# Patient Record
Sex: Male | Born: 1955 | Race: White | Hispanic: No | Marital: Married | Smoking: Never smoker
Health system: Southern US, Community
[De-identification: ages and names within clinical notes are randomized; demographics above are authoritative.]

## PROBLEM LIST (undated history)

## (undated) DIAGNOSIS — M1990 Unspecified osteoarthritis, unspecified site: Secondary | ICD-10-CM

## (undated) DIAGNOSIS — R51 Headache: Secondary | ICD-10-CM

## (undated) DIAGNOSIS — F419 Anxiety disorder, unspecified: Secondary | ICD-10-CM

## (undated) DIAGNOSIS — G4730 Sleep apnea, unspecified: Secondary | ICD-10-CM

## (undated) DIAGNOSIS — R519 Headache, unspecified: Secondary | ICD-10-CM

## (undated) HISTORY — PX: DG GREAT TOE LEFT FOOT: HXRAD1656

---

## 2002-05-09 ENCOUNTER — Ambulatory Visit (HOSPITAL_BASED_OUTPATIENT_CLINIC_OR_DEPARTMENT_OTHER): Admission: RE | Admit: 2002-05-09 | Discharge: 2002-05-09 | Payer: Self-pay | Admitting: Otolaryngology

## 2011-11-14 HISTORY — PX: COLONOSCOPY: SHX174

## 2017-06-26 NOTE — H&P (Signed)
TOTAL KNEE ADMISSION H&P  Patient is being admitted for right total knee arthroplasty.  Subjective:  Chief Complaint:right knee pain.  HPI: Justin RoyalsGreg R Charles, 61 y.o. male, has a history of pain and functional disability in the right knee due to arthritis and has failed non-surgical conservative treatments for greater than 12 weeks to includeNSAID's and/or analgesics and corticosteriod injections.  Onset of symptoms was gradual, starting 5 years ago with gradually worsening course since that time. The patient noted no past surgery on the right knee(s).  Patient currently rates pain in the right knee(s) at 4 out of 10 with activity. Patient has pain that interferes with activities of daily living.  Patient has evidence of subchondral sclerosis and joint space narrowing by imaging studies. There is no active infection.  There are no active problems to display for this patient.  No past medical history on file.  No past surgical history on file.  No prescriptions prior to admission.   Allergies not on file  Social History  Substance Use Topics  . Smoking status: Not on file  . Smokeless tobacco: Not on file  . Alcohol use Not on file    No family history on file.   Review of Systems  Constitutional: Negative.   HENT: Negative.   Eyes: Negative.   Respiratory: Negative.   Cardiovascular: Negative.   Gastrointestinal: Negative.   Genitourinary: Negative.   Musculoskeletal: Positive for joint pain.  Skin: Negative.   Neurological: Negative.   Endo/Heme/Allergies: Negative.   Psychiatric/Behavioral: Negative.     Objective:  Physical Exam  Constitutional: He is oriented to person, place, and time. He appears well-developed and well-nourished.  HENT:  Head: Normocephalic and atraumatic.  Eyes: Pupils are equal, round, and reactive to light. EOM are normal.  Neck: Normal range of motion. Neck supple.  Cardiovascular: Normal rate and regular rhythm.   Respiratory: Effort normal and  breath sounds normal.  GI: Soft. Bowel sounds are normal.  Musculoskeletal:  Examination of his right knee reveals range of motion 0-120 degrees; medial joint line tenderness; moderate patellofemoral crepitus.  Ligaments are stable.  He is neurovascularly intact distally.   Neurological: He is alert and oriented to person, place, and time.  Skin: Skin is warm and dry.  Psychiatric: He has a normal mood and affect. His behavior is normal. Judgment and thought content normal.    Vital signs in last 24 hours:    Labs:   There is no height or weight on file to calculate BMI.   Imaging Review Plain radiographs demonstrate severe degenerative joint disease of the right knee(s). The overall alignment ismild varus. The bone quality appears to be fair for age and reported activity level.  Assessment/Plan:  End stage arthritis, right knee   The patient history, physical examination, clinical judgment of the provider and imaging studies are consistent with end stage degenerative joint disease of the right knee(s) and total knee arthroplasty is deemed medically necessary. The treatment options including medical management, injection therapy arthroscopy and arthroplasty were discussed at length. The risks and benefits of total knee arthroplasty were presented and reviewed. The risks due to aseptic loosening, infection, stiffness, patella tracking problems, thromboembolic complications and other imponderables were discussed. The patient acknowledged the explanation, agreed to proceed with the plan and consent was signed. Patient is being admitted for inpatient treatment for surgery, pain control, PT, OT, prophylactic antibiotics, VTE prophylaxis, progressive ambulation and ADL's and discharge planning. The patient is planning to be discharged home with  home health services

## 2017-06-29 ENCOUNTER — Encounter (HOSPITAL_COMMUNITY): Payer: Self-pay

## 2017-06-29 ENCOUNTER — Encounter (HOSPITAL_COMMUNITY)
Admission: RE | Admit: 2017-06-29 | Discharge: 2017-06-29 | Disposition: A | Discharge status: Home / Self Care | Payer: Managed Care, Other (non HMO) | Source: Ambulatory Visit | Attending: Orthopedic Surgery | Admitting: Orthopedic Surgery

## 2017-06-29 DIAGNOSIS — Z01812 Encounter for preprocedural laboratory examination: Secondary | ICD-10-CM | POA: Insufficient documentation

## 2017-06-29 DIAGNOSIS — M1711 Unilateral primary osteoarthritis, right knee: Secondary | ICD-10-CM | POA: Diagnosis not present

## 2017-06-29 DIAGNOSIS — Z01810 Encounter for preprocedural cardiovascular examination: Secondary | ICD-10-CM | POA: Insufficient documentation

## 2017-06-29 DIAGNOSIS — I4510 Unspecified right bundle-branch block: Secondary | ICD-10-CM | POA: Insufficient documentation

## 2017-06-29 HISTORY — DX: Headache, unspecified: R51.9

## 2017-06-29 HISTORY — DX: Headache: R51

## 2017-06-29 HISTORY — DX: Anxiety disorder, unspecified: F41.9

## 2017-06-29 HISTORY — DX: Unspecified osteoarthritis, unspecified site: M19.90

## 2017-06-29 LAB — URINALYSIS, COMPLETE (UACMP) WITH MICROSCOPIC
Bacteria, UA: NONE SEEN
Bilirubin Urine: NEGATIVE
Glucose, UA: NEGATIVE mg/dL
HGB URINE DIPSTICK: NEGATIVE
Ketones, ur: NEGATIVE mg/dL
Leukocytes, UA: NEGATIVE
Nitrite: NEGATIVE
PH: 5 (ref 5.0–8.0)
Protein, ur: NEGATIVE mg/dL
SPECIFIC GRAVITY, URINE: 1.011 (ref 1.005–1.030)
Squamous Epithelial / LPF: NONE SEEN

## 2017-06-29 LAB — SURGICAL PCR SCREEN
MRSA, PCR: NEGATIVE
Staphylococcus aureus: POSITIVE — AB

## 2017-06-29 LAB — CBC
HCT: 44.1 % (ref 39.0–52.0)
HEMOGLOBIN: 15.1 g/dL (ref 13.0–17.0)
MCH: 33 pg (ref 26.0–34.0)
MCHC: 34.2 g/dL (ref 30.0–36.0)
MCV: 96.3 fL (ref 78.0–100.0)
Platelets: 158 10*3/uL (ref 150–400)
RBC: 4.58 MIL/uL (ref 4.22–5.81)
RDW: 12.8 % (ref 11.5–15.5)
WBC: 6.3 10*3/uL (ref 4.0–10.5)

## 2017-06-29 LAB — BASIC METABOLIC PANEL
ANION GAP: 9 (ref 5–15)
BUN: 14 mg/dL (ref 6–20)
CALCIUM: 9.8 mg/dL (ref 8.9–10.3)
CO2: 27 mmol/L (ref 22–32)
Chloride: 105 mmol/L (ref 101–111)
Creatinine, Ser: 1.01 mg/dL (ref 0.61–1.24)
GFR calc Af Amer: >60 mL/min (ref >60)
GFR calc non Af Amer: >60 mL/min (ref >60)
Glucose, Bld: 100 mg/dL — ABNORMAL HIGH (ref 65–99)
Potassium: 4.3 mmol/L (ref 3.5–5.1)
Sodium: 141 mmol/L (ref 135–145)

## 2017-06-29 LAB — TYPE AND SCREEN
ABO/RH(D): A POS
ANTIBODY SCREEN: NEGATIVE

## 2017-06-29 LAB — ABO/RH: ABO/RH(D): A POS

## 2017-06-29 NOTE — Progress Notes (Addendum)
WJX:BJYNW, Denny Peon, MD  Cardiologist: pt denies  EKG: pt denies past year  Stress test: pt denies ever  ECHO: pt denies ever  Cardiac Cath: pt denies ever  Chest x-ray: pt denies past year, no recent respiratory complications

## 2017-06-29 NOTE — Pre-Procedure Instructions (Signed)
    Justin Charles  06/29/2017     No Pharmacies Listed   Your procedure is scheduled on July 11, 2017.  Report to [facility] Admitting at 530 AM.  Call this number if you have problems the morning of surgery:  [phone]   Remember:  Do not eat food or drink liquids after midnight.  Take these medicines the morning of surgery with A SIP OF WATER: (none)  7 days prior to surgery STOP taking any Aspirin, Aleve, Naproxen, Ibuprofen, Motrin, Advil, Goody's, BC's, all herbal medications, fish oil, and all vitamins   Do not wear jewelry, make-up or nail polish.  Do not wear lotions, powders, or perfumes, or deoderant.   Men may shave face and neck.  Do not bring valuables to the hospital.  [facility] is not responsible for any belongings or valuables.  Contacts, dentures or bridgework may not be worn into surgery.  Leave your suitcase in the car.  After surgery it may be brought to your room.  For patients admitted to the hospital, discharge time will be determined by your treatment team.  Patients discharged the day of surgery will not be allowed to drive home.    Special instructions:   [place]- Preparing For Surgery  Before surgery, you can play an important role. Because skin is not sterile, your skin needs to be as free of germs as possible. You can reduce the number of germs on your skin by washing with CHG (chlorahexidine gluconate) Soap before surgery.  CHG is an antiseptic cleaner which kills germs and bonds with the skin to continue killing germs even after washing.  Please do not use if you have an allergy to CHG or antibacterial soaps. If your skin becomes reddened/irritated stop using the CHG.  Do not shave (including legs and underarms) for at least 48 hours prior to first CHG shower. It is OK to shave your face.  Please follow these instructions carefully.   1. Shower the NIGHT BEFORE SURGERY and the MORNING OF SURGERY with CHG.   2. If you chose  to wash your hair, wash your hair first as usual with your normal shampoo.  3. After you shampoo, rinse your hair and body thoroughly to remove the shampoo.  4. Use CHG as you would any other liquid soap. You can apply CHG directly to the skin and wash gently with a scrungie or a clean washcloth.   5. Apply the CHG Soap to your body ONLY FROM THE NECK DOWN.  Do not use on open wounds or open sores. Avoid contact with your eyes, ears, mouth and genitals (private parts). Wash genitals (private parts) with your normal soap.  6. Wash thoroughly, paying special attention to the area where your surgery will be performed.  7. Thoroughly rinse your body with warm water from the neck down.  8. DO NOT shower/wash with your normal soap after using and rinsing off the CHG Soap.  9. Pat yourself dry with a CLEAN TOWEL.   10. Wear CLEAN PAJAMAS   11. Place CLEAN SHEETS on your bed the night of your first shower and DO NOT SLEEP WITH PETS.    Day of Surgery: Do not apply any deodorants/lotions. Please wear clean clothes to the hospital/surgery center.     Please read over the fact sheets that you were given.

## 2017-06-30 LAB — URINE CULTURE: Culture: NO GROWTH

## 2017-07-02 NOTE — Progress Notes (Addendum)
Anesthesia Chart Review:  Pt is a 61 year old male scheduled for R total knee arthroplasty on 07/11/2017 with Mckinley Jewel, MD  - PCP is Derek Jack, MD (notes in care everywhere)   PMH includes:  Arthritis. Never smoker. BMI 31  Medications reviewed  Preoperative labs reviewed.    EKG 06/29/17: NSR. RBBB. LAFB. Bifascicular block. Appears stable when compared to EKG 02/16/16  If no changes, I anticipate pt can proceed with surgery as scheduled.   Rica Mast, FNP-BC [facility] Short Stay Surgical Center/Anesthesiology Phone: [phone] 07/04/2017 11:55 AM

## 2017-07-10 MED ORDER — TRANEXAMIC ACID 1000 MG/10ML IV SOLN
1000.0000 mg | INTRAVENOUS | Status: AC
  Administered 2017-07-11: 1000 mg via INTRAVENOUS
  Filled 2017-07-10: qty 10

## 2017-07-10 MED ORDER — CEFAZOLIN SODIUM-DEXTROSE 2-4 GM/100ML-% IV SOLN
2.0000 g | INTRAVENOUS | Status: AC
  Administered 2017-07-11: 2 g via INTRAVENOUS
  Filled 2017-07-10: qty 100

## 2017-07-11 ENCOUNTER — Inpatient Hospital Stay (HOSPITAL_COMMUNITY): Payer: Managed Care, Other (non HMO) | Admitting: Anesthesiology

## 2017-07-11 ENCOUNTER — Inpatient Hospital Stay (HOSPITAL_COMMUNITY): Payer: Managed Care, Other (non HMO) | Admitting: Emergency Medicine

## 2017-07-11 ENCOUNTER — Encounter (HOSPITAL_COMMUNITY)
Admission: RE | Disposition: A | Discharge status: Home / Self Care | Payer: Self-pay | Source: Home / Self Care | Attending: Orthopedic Surgery

## 2017-07-11 ENCOUNTER — Inpatient Hospital Stay (HOSPITAL_COMMUNITY)
Admission: RE | Admit: 2017-07-11 | Discharge: 2017-07-12 | DRG: 470 | Disposition: A | Discharge status: Home / Self Care | Payer: Managed Care, Other (non HMO) | Attending: Orthopedic Surgery | Admitting: Orthopedic Surgery

## 2017-07-11 ENCOUNTER — Inpatient Hospital Stay (HOSPITAL_COMMUNITY): Payer: Managed Care, Other (non HMO)

## 2017-07-11 ENCOUNTER — Encounter (HOSPITAL_COMMUNITY): Payer: Self-pay | Admitting: Urology

## 2017-07-11 DIAGNOSIS — I959 Hypotension, unspecified: Secondary | ICD-10-CM | POA: Diagnosis not present

## 2017-07-11 DIAGNOSIS — R55 Syncope and collapse: Secondary | ICD-10-CM | POA: Diagnosis not present

## 2017-07-11 DIAGNOSIS — M1711 Unilateral primary osteoarthritis, right knee: Secondary | ICD-10-CM | POA: Diagnosis present

## 2017-07-11 DIAGNOSIS — Z96659 Presence of unspecified artificial knee joint: Secondary | ICD-10-CM

## 2017-07-11 HISTORY — DX: Sleep apnea, unspecified: G47.30

## 2017-07-11 HISTORY — PX: TOTAL KNEE ARTHROPLASTY: SHX125

## 2017-07-11 SURGERY — ARTHROPLASTY, KNEE, TOTAL
Anesthesia: Spinal | Site: Knee | Laterality: Right

## 2017-07-11 MED ORDER — OXYCODONE HCL 5 MG PO TABS
5.0000 mg | ORAL_TABLET | ORAL | Status: DC | PRN
  Administered 2017-07-11: 10 mg via ORAL
  Administered 2017-07-11: 5 mg via ORAL
  Administered 2017-07-12 (×4): 10 mg via ORAL
  Filled 2017-07-11 (×5): qty 2
  Filled 2017-07-11: qty 1

## 2017-07-11 MED ORDER — ASPIRIN EC 325 MG PO TBEC
325.0000 mg | DELAYED_RELEASE_TABLET | Freq: Every day | ORAL | Status: DC
  Administered 2017-07-12: 325 mg via ORAL
  Filled 2017-07-11: qty 1

## 2017-07-11 MED ORDER — BUPIVACAINE IN DEXTROSE 0.75-8.25 % IT SOLN
INTRATHECAL | Status: DC | PRN
  Administered 2017-07-11: 2 mL via INTRATHECAL

## 2017-07-11 MED ORDER — ACETAMINOPHEN 650 MG RE SUPP: 650.0000 mg | Freq: Four times a day (QID) | RECTAL | Status: DC | PRN

## 2017-07-11 MED ORDER — CHLORHEXIDINE GLUCONATE 4 % EX LIQD: 60.0000 mL | Freq: Once | CUTANEOUS | Status: DC

## 2017-07-11 MED ORDER — MENTHOL 3 MG MT LOZG: 1.0000 | LOZENGE | OROMUCOSAL | Status: DC | PRN

## 2017-07-11 MED ORDER — SODIUM CHLORIDE 0.9 % IJ SOLN
INTRAMUSCULAR | Status: DC | PRN
  Administered 2017-07-11: 40 mL

## 2017-07-11 MED ORDER — MIDAZOLAM HCL 5 MG/5ML IJ SOLN
INTRAMUSCULAR | Status: DC | PRN
  Administered 2017-07-11: 2 mg via INTRAVENOUS

## 2017-07-11 MED ORDER — ASPIRIN EC 325 MG PO TBEC: 325.0000 mg | DELAYED_RELEASE_TABLET | Freq: Every day | ORAL | 0 refills | Status: AC

## 2017-07-11 MED ORDER — LIDOCAINE 2% (20 MG/ML) 5 ML SYRINGE
INTRAMUSCULAR | Status: AC
  Filled 2017-07-11: qty 5

## 2017-07-11 MED ORDER — ACETAMINOPHEN 325 MG PO TABS
650.0000 mg | ORAL_TABLET | Freq: Four times a day (QID) | ORAL | Status: DC | PRN
  Administered 2017-07-12: 650 mg via ORAL
  Filled 2017-07-11: qty 2

## 2017-07-11 MED ORDER — BISACODYL 5 MG PO TBEC
5.0000 mg | DELAYED_RELEASE_TABLET | Freq: Every day | ORAL | Status: DC | PRN
Start: 2017-07-11 — End: 2017-07-12

## 2017-07-11 MED ORDER — ONDANSETRON HCL 4 MG PO TABS: 4.0000 mg | ORAL_TABLET | Freq: Three times a day (TID) | ORAL | 0 refills | Status: AC | PRN

## 2017-07-11 MED ORDER — LIDOCAINE 2% (20 MG/ML) 5 ML SYRINGE
INTRAMUSCULAR | Status: DC | PRN
  Administered 2017-07-11: 25 mg via INTRAVENOUS

## 2017-07-11 MED ORDER — CEFAZOLIN SODIUM-DEXTROSE 2-4 GM/100ML-% IV SOLN
2.0000 g | Freq: Four times a day (QID) | INTRAVENOUS | Status: AC
  Administered 2017-07-11 (×2): 2 g via INTRAVENOUS
  Filled 2017-07-11 (×2): qty 100

## 2017-07-11 MED ORDER — BUPIVACAINE LIPOSOME 1.3 % IJ SUSP
INTRAMUSCULAR | Status: DC | PRN
  Administered 2017-07-11: 20 mL

## 2017-07-11 MED ORDER — DEXAMETHASONE SODIUM PHOSPHATE 10 MG/ML IJ SOLN
INTRAMUSCULAR | Status: AC
  Filled 2017-07-11: qty 1

## 2017-07-11 MED ORDER — PROPOFOL 1000 MG/100ML IV EMUL
INTRAVENOUS | Status: AC
  Filled 2017-07-11: qty 100

## 2017-07-11 MED ORDER — CELECOXIB 200 MG PO CAPS
200.0000 mg | ORAL_CAPSULE | Freq: Two times a day (BID) | ORAL | Status: DC
  Administered 2017-07-11 – 2017-07-12 (×3): 200 mg via ORAL
  Filled 2017-07-11 (×3): qty 1

## 2017-07-11 MED ORDER — PHENOL 1.4 % MT LIQD: 1.0000 | OROMUCOSAL | Status: DC | PRN

## 2017-07-11 MED ORDER — CALCIUM CARBONATE ANTACID 750 MG PO CHEW: 2.0000 | CHEWABLE_TABLET | ORAL | Status: DC | PRN

## 2017-07-11 MED ORDER — LACTATED RINGERS IV SOLN
INTRAVENOUS | Status: DC | PRN
  Administered 2017-07-11 (×2): via INTRAVENOUS

## 2017-07-11 MED ORDER — POLYETHYLENE GLYCOL 3350 17 G PO PACK: 17.0000 g | PACK | Freq: Every day | ORAL | Status: DC | PRN

## 2017-07-11 MED ORDER — SODIUM CHLORIDE 0.9 % IV BOLUS (SEPSIS)
1000.0000 mL | Freq: Once | INTRAVENOUS | Status: AC
  Administered 2017-07-11: 1000 mL via INTRAVENOUS

## 2017-07-11 MED ORDER — OXYCODONE-ACETAMINOPHEN 5-325 MG PO TABS: 1.0000 | ORAL_TABLET | ORAL | 0 refills | Status: AC | PRN

## 2017-07-11 MED ORDER — OXYCODONE HCL 5 MG PO TABS: 5.0000 mg | ORAL_TABLET | Freq: Once | ORAL | Status: DC | PRN

## 2017-07-11 MED ORDER — CALCIUM CARBONATE ANTACID 500 MG PO CHEW: 2.0000 | CHEWABLE_TABLET | ORAL | Status: DC | PRN

## 2017-07-11 MED ORDER — ZOLPIDEM TARTRATE 5 MG PO TABS: 5.0000 mg | ORAL_TABLET | Freq: Every evening | ORAL | Status: DC | PRN

## 2017-07-11 MED ORDER — ONDANSETRON HCL 4 MG/2ML IJ SOLN
INTRAMUSCULAR | Status: AC
  Filled 2017-07-11: qty 2

## 2017-07-11 MED ORDER — MIDAZOLAM HCL 2 MG/2ML IJ SOLN
INTRAMUSCULAR | Status: AC
  Filled 2017-07-11: qty 2

## 2017-07-11 MED ORDER — POTASSIUM CHLORIDE IN NACL 20-0.9 MEQ/L-% IV SOLN
INTRAVENOUS | Status: DC
  Administered 2017-07-11: 16:00:00 via INTRAVENOUS
  Filled 2017-07-11: qty 1000

## 2017-07-11 MED ORDER — DOCUSATE SODIUM 100 MG PO CAPS
100.0000 mg | ORAL_CAPSULE | Freq: Two times a day (BID) | ORAL | Status: DC
  Administered 2017-07-12: 100 mg via ORAL
  Filled 2017-07-11 (×2): qty 1

## 2017-07-11 MED ORDER — METOCLOPRAMIDE HCL 5 MG/ML IJ SOLN
5.0000 mg | Freq: Three times a day (TID) | INTRAMUSCULAR | Status: DC | PRN
Start: 2017-07-11 — End: 2017-07-12

## 2017-07-11 MED ORDER — BUPIVACAINE LIPOSOME 1.3 % IJ SUSP
20.0000 mL | INTRAMUSCULAR | Status: DC
  Filled 2017-07-11: qty 20

## 2017-07-11 MED ORDER — DEXAMETHASONE SODIUM PHOSPHATE 10 MG/ML IJ SOLN
INTRAMUSCULAR | Status: DC | PRN
  Administered 2017-07-11: 10 mg via INTRAVENOUS

## 2017-07-11 MED ORDER — PROPOFOL 500 MG/50ML IV EMUL
INTRAVENOUS | Status: DC | PRN
  Administered 2017-07-11: 100 ug/kg/min via INTRAVENOUS

## 2017-07-11 MED ORDER — HYDROMORPHONE HCL 1 MG/ML IJ SOLN: 0.5000 mg | INTRAMUSCULAR | Status: DC | PRN

## 2017-07-11 MED ORDER — MAGNESIUM CITRATE PO SOLN: 1.0000 | Freq: Once | ORAL | Status: DC | PRN

## 2017-07-11 MED ORDER — ONDANSETRON HCL 4 MG/2ML IJ SOLN
INTRAMUSCULAR | Status: DC | PRN
  Administered 2017-07-11: 4 mg via INTRAVENOUS

## 2017-07-11 MED ORDER — PROPOFOL 10 MG/ML IV BOLUS
INTRAVENOUS | Status: AC
  Filled 2017-07-11: qty 40

## 2017-07-11 MED ORDER — OXYCODONE HCL 5 MG/5ML PO SOLN: 5.0000 mg | Freq: Once | ORAL | Status: DC | PRN

## 2017-07-11 MED ORDER — ONDANSETRON HCL 4 MG PO TABS: 4.0000 mg | ORAL_TABLET | Freq: Four times a day (QID) | ORAL | Status: DC | PRN

## 2017-07-11 MED ORDER — METHOCARBAMOL 500 MG PO TABS
500.0000 mg | ORAL_TABLET | Freq: Four times a day (QID) | ORAL | Status: DC | PRN
  Administered 2017-07-12 (×2): 500 mg via ORAL
  Filled 2017-07-11 (×2): qty 1

## 2017-07-11 MED ORDER — METHOCARBAMOL 1000 MG/10ML IJ SOLN: 500.0000 mg | Freq: Four times a day (QID) | INTRAMUSCULAR | Status: DC | PRN

## 2017-07-11 MED ORDER — DIPHENHYDRAMINE HCL 12.5 MG/5ML PO ELIX
12.5000 mg | ORAL_SOLUTION | ORAL | Status: DC | PRN
  Administered 2017-07-11: 25 mg via ORAL
  Filled 2017-07-11: qty 10

## 2017-07-11 MED ORDER — ONDANSETRON HCL 4 MG/2ML IJ SOLN: 4.0000 mg | Freq: Four times a day (QID) | INTRAMUSCULAR | Status: DC | PRN

## 2017-07-11 MED ORDER — LACTATED RINGERS IV SOLN: INTRAVENOUS | Status: DC

## 2017-07-11 MED ORDER — SODIUM CHLORIDE 0.9 % IR SOLN
Status: DC | PRN
  Administered 2017-07-11: 1000 mL

## 2017-07-11 MED ORDER — FENTANYL CITRATE (PF) 250 MCG/5ML IJ SOLN
INTRAMUSCULAR | Status: AC
  Filled 2017-07-11: qty 5

## 2017-07-11 MED ORDER — METOCLOPRAMIDE HCL 5 MG PO TABS: 5.0000 mg | ORAL_TABLET | Freq: Three times a day (TID) | ORAL | Status: DC | PRN

## 2017-07-11 MED ORDER — HYDROMORPHONE HCL 1 MG/ML IJ SOLN: 0.2500 mg | INTRAMUSCULAR | Status: DC | PRN

## 2017-07-11 MED ORDER — DEXAMETHASONE SODIUM PHOSPHATE 10 MG/ML IJ SOLN
10.0000 mg | Freq: Once | INTRAMUSCULAR | Status: AC
  Administered 2017-07-12: 10 mg via INTRAVENOUS
  Filled 2017-07-11: qty 1

## 2017-07-11 SURGICAL SUPPLY — 54 items
BANDAGE ACE 4X5 VEL STRL LF (GAUZE/BANDAGES/DRESSINGS) ×3 IMPLANT
BANDAGE ACE 6X5 VEL STRL LF (GAUZE/BANDAGES/DRESSINGS) ×3 IMPLANT
BANDAGE ESMARK 6X9 LF (GAUZE/BANDAGES/DRESSINGS) ×1 IMPLANT
BLADE SAG 18X100X1.27 (BLADE) ×6 IMPLANT
BNDG ESMARK 6X9 LF (GAUZE/BANDAGES/DRESSINGS) ×3
BOWL SMART MIX CTS (DISPOSABLE) IMPLANT
CAPT KNEE TRIATH TK-4 ×3 IMPLANT
CLOSURE STERI-STRIP 1/2X4 (GAUZE/BANDAGES/DRESSINGS) ×1
CLOSURE WOUND 1/2 X4 (GAUZE/BANDAGES/DRESSINGS) ×2
CLSR STERI-STRIP ANTIMIC 1/2X4 (GAUZE/BANDAGES/DRESSINGS) ×2 IMPLANT
COVER SURGICAL LIGHT HANDLE (MISCELLANEOUS) ×3 IMPLANT
CUFF TOURNIQUET SINGLE 34IN LL (TOURNIQUET CUFF) ×3 IMPLANT
DRAPE EXTREMITY T 121X128X90 (DRAPE) ×3 IMPLANT
DRAPE U-SHAPE 47X51 STRL (DRAPES) ×3 IMPLANT
DRSG AQUACEL AG ADV 3.5X10 (GAUZE/BANDAGES/DRESSINGS) ×3 IMPLANT
DURAPREP 26ML APPLICATOR (WOUND CARE) ×6 IMPLANT
ELECT CAUTERY BLADE 6.4 (BLADE) ×3 IMPLANT
ELECT REM PT RETURN 9FT ADLT (ELECTROSURGICAL) ×3
ELECTRODE REM PT RTRN 9FT ADLT (ELECTROSURGICAL) ×1 IMPLANT
FACESHIELD WRAPAROUND (MASK) ×6 IMPLANT
GLOVE BIOGEL PI IND STRL 7.0 (GLOVE) ×1 IMPLANT
GLOVE BIOGEL PI INDICATOR 7.0 (GLOVE) ×2
GLOVE ECLIPSE 7.0 STRL STRAW (GLOVE) ×3 IMPLANT
GLOVE ORTHO TXT STRL SZ7.5 (GLOVE) ×3 IMPLANT
GOWN STRL REUS W/ TWL LRG LVL3 (GOWN DISPOSABLE) ×2 IMPLANT
GOWN STRL REUS W/ TWL XL LVL3 (GOWN DISPOSABLE) ×1 IMPLANT
GOWN STRL REUS W/TWL LRG LVL3 (GOWN DISPOSABLE) ×4
GOWN STRL REUS W/TWL XL LVL3 (GOWN DISPOSABLE) ×2
HANDPIECE INTERPULSE COAX TIP (DISPOSABLE) ×2
IMMOBILIZER KNEE 22 UNIV (SOFTGOODS) IMPLANT
KIT BASIN OR (CUSTOM PROCEDURE TRAY) ×3 IMPLANT
KIT ROOM TURNOVER OR (KITS) ×3 IMPLANT
MANIFOLD NEPTUNE II (INSTRUMENTS) ×3 IMPLANT
NEEDLE 18GX1X1/2 (RX/OR ONLY) (NEEDLE) ×3 IMPLANT
NEEDLE HYPO 25GX1X1/2 BEV (NEEDLE) IMPLANT
NS IRRIG 1000ML POUR BTL (IV SOLUTION) ×3 IMPLANT
PACK TOTAL JOINT (CUSTOM PROCEDURE TRAY) ×3 IMPLANT
PAD ARMBOARD 7.5X6 YLW CONV (MISCELLANEOUS) ×6 IMPLANT
SET HNDPC FAN SPRY TIP SCT (DISPOSABLE) ×1 IMPLANT
STRIP CLOSURE SKIN 1/2X4 (GAUZE/BANDAGES/DRESSINGS) ×4 IMPLANT
SUCTION FRAZIER HANDLE 10FR (MISCELLANEOUS)
SUCTION TUBE FRAZIER 10FR DISP (MISCELLANEOUS) IMPLANT
SUT MNCRL AB 4-0 PS2 18 (SUTURE) ×3 IMPLANT
SUT VIC AB 0 CT1 27 (SUTURE)
SUT VIC AB 0 CT1 27XBRD ANBCTR (SUTURE) IMPLANT
SUT VIC AB 1 CTX 36 (SUTURE) ×2
SUT VIC AB 1 CTX36XBRD ANBCTR (SUTURE) ×1 IMPLANT
SUT VIC AB 2-0 CT1 27 (SUTURE) ×4
SUT VIC AB 2-0 CT1 TAPERPNT 27 (SUTURE) ×2 IMPLANT
SYR 50ML LL SCALE MARK (SYRINGE) ×3 IMPLANT
SYR CONTROL 10ML LL (SYRINGE) IMPLANT
TOWEL GREEN STERILE (TOWEL DISPOSABLE) ×3 IMPLANT
TRAY CATH 16FR W/PLASTIC CATH (SET/KITS/TRAYS/PACK) IMPLANT
WATER STERILE IRR 1000ML POUR (IV SOLUTION) ×3 IMPLANT

## 2017-07-11 NOTE — Anesthesia Postprocedure Evaluation (Signed)
Anesthesia Post Note  Patient: Justin Charles  Procedure(s) Performed: Procedure(s) (LRB): TOTAL KNEE ARTHROPLASTY (Right)     Patient location during evaluation: PACU Anesthesia Type: Spinal Level of consciousness: oriented and awake and alert Pain management: pain level controlled Vital Signs Assessment: post-procedure vital signs reviewed and stable Respiratory status: spontaneous breathing, respiratory function stable and patient connected to nasal cannula oxygen Cardiovascular status: blood pressure returned to baseline and stable Postop Assessment: no headache and no backache Anesthetic complications: no    Last Vitals:  Vitals:   07/11/17 1045 07/11/17 1100  BP:    Pulse: 60   Resp: 13   Temp:  36.5 C  SpO2: 98%     Last Pain:  Vitals:   07/11/17 1030  TempSrc:   PainSc: 0-No pain                 Eisa Conaway,JAMES TERRILL

## 2017-07-11 NOTE — Discharge Instructions (Signed)

## 2017-07-11 NOTE — Op Note (Signed)
NAMCharlann Lange:  Consiglio, Balen                  ACCOUNT NO.:  [id number]  MEDICAL RECORD NO.:  [id number]  LOCATION:  [facility]                         FACILITY:  [facility]  PHYSICIAN:  Loreta Aveaniel F. Taequan Stockhausen, M.D. DATE OF BIRTH:  Nov 10, 1956  DATE OF PROCEDURE:  07/11/2017 DATE OF DISCHARGE:                              OPERATIVE REPORT   PREOPERATIVE DIAGNOSIS:  Right knee end-stage arthritis, varus alignment, flexion contracture.  Primary, localized.  POSTOPERATIVE DIAGNOSES:  Right knee end-stage arthritis, varus alignment, flexion contracture.  Primary, localized.  PROCEDURE:  Modified minimally invasive right total knee replacement Stryker triathlon prosthesis.  All components press-fit.  A #6 pegged posterior stabilized femoral component.  #7 tibial component, 9 mm PS insert.  A 38 mm patellar component.  SURGEON:  Loreta Aveaniel F. Areeba Sulser, M.D.  ASSISTANT:  Tessa LernerLindsey Stanbery, PA, present throughout the entire case and necessary for timely completion of procedure.  ANESTHESIA:  Spinal.  BLOOD LOSS:  Minimal.  SPECIMENS:  None.  CULTURES:  None.  COMPLICATIONS:  None.  DRESSINGS:  Sterile compressive, knee immobilizer.  TOURNIQUET TIME:  50 minutes.  DESCRIPTION OF PROCEDURE:  The patient was brought to the operating room, placed on the operating table in supine position.  After adequate anesthesia had been obtained, tourniquet applied.  Prepped and draped in usual sterile fashion.  Exsanguinated with elevation of Esmarch. Tourniquet inflated to 350 mmHg.  Straight incision above the patella down to tibial tubercle.  Medial arthrotomy, vastus splitting.  Flexible intramedullary guide distal femur, 10 mm resection 5 degrees of valgus. Using epicondylar axis, the femur was sized, cut, and fitted for a pegged posterior stabilized #6 component.  Proximal tibial resection with extramedullary guide.  Size #7 component, rotation set with trials and reamed.  Patella exposed.  Posterior 10 mm removed.   Drilled, sized, and fitted for a 38 mm component.  At completion, all components firmly seated.  Polyethylene attached to tibia and patella in place.  Very pleased with biomechanical axis, full motion, good stability, good patellar tracking.  Nicely balanced knee. Wound irrigated.  Injected with Exparel.  Arthrotomy closed with #1 Vicryl.  Subcutaneous and subcuticular closure.  Sterile compressive dressing applied.  Tourniquet deflated and removed.  Knee immobilizer applied.  Anesthesia reversed.  Brought to the recovery room.  Tolerated the surgery well.  No complications.     Loreta Aveaniel F. Britten Parady, M.D.     DFM/MEDQ  D:  07/11/2017  T:  07/11/2017  Job:  [id number]

## 2017-07-11 NOTE — Progress Notes (Signed)
Pt arrived to floor around 2pm from PACU. Pt stated he felt the urge to void around 2:30 pm. He sat EOB with RN present with no success. About ten minutes later, pt and this RN attempted again because pt now stated his abdomen was "painful" and was distended. He had an incontinent episode in PACU per report. He stood this time EOB and reported feeling lightheaded. He sat back down and became diaphoretic and hot. Pt's BP was 87/49, and was very pale. Cool wash cloth applied to forehead, and room was cooled. Bladder scan showed >999 urine. Notified [place], [place] of event, received order for 1L NS fluid bolus and to I&O cath pt. Fluid bolus started and pt's BP gradually increased to 120/73. 800 cc of yellow urine returned. Pt reported feeling better.  [place], Latricia HeftKorie G

## 2017-07-11 NOTE — Anesthesia Procedure Notes (Signed)
Spinal  Patient location during procedure: OR Start time: 07/11/2017 7:35 AM End time: 07/11/2017 7:47 AM Staffing Anesthesiologist: Sharee HolsterMASSAGEE, Justin Charles Performed: anesthesiologist  Preanesthetic Checklist Completed: patient identified, site marked, surgical consent, pre-op evaluation, timeout performed, IV checked, risks and benefits discussed and monitors and equipment checked Spinal Block Patient position: sitting Prep: ChloraPrep and site prepped and draped Patient monitoring: heart rate, cardiac monitor, continuous pulse ox and blood pressure Approach: right paramedian Location: L3-4 Injection technique: single-shot Needle Needle gauge: 25 G Needle insertion depth: 5 cm Assessment Sensory level: T6

## 2017-07-11 NOTE — Discharge Summary (Addendum)
Patient ID: Justin Charles MRN: [id number] DOB/AGE: 1956-02-12 61 y.o.  Admit date: 07/11/2017 Discharge date: 07/12/2017  Admission Diagnoses:  Active Problems:   Primary localized osteoarthritis of right knee   Discharge Diagnoses:  Same  Past Medical History:  Diagnosis Date  . Anxiety   . Arthritis   . Headache   . Sleep apnea    DOES NOT USE CPAP    Surgeries: Procedure(s): TOTAL KNEE ARTHROPLASTY on 07/11/2017   Consultants:   Discharged Condition: Improved  Hospital Course: HEINZ ECKERT is an 61 y.o. male who was admitted 07/11/2017 for operative treatment of<principal problem not specified>. Patient has severe unremitting pain that affects sleep, daily activities, and work/hobbies. After pre-op clearance the patient was taken to the operating room on 07/11/2017 and underwent  Procedure(s): TOTAL KNEE ARTHROPLASTY.    Patient was given perioperative antibiotics:  Anti-infectives    Start     Dose/Rate Route Frequency Ordered Stop   07/11/17 1500  ceFAZolin (ANCEF) IVPB 2g/100 mL premix     2 g 200 mL/hr over 30 Minutes Intravenous Every 6 hours 07/11/17 1407 07/11/17 2227   07/11/17 0715  ceFAZolin (ANCEF) IVPB 2g/100 mL premix     2 g 200 mL/hr over 30 Minutes Intravenous On call to O.R. 07/10/17 1244 07/11/17 0734       Patient was given sequential compression devices, early ambulation, and chemoprophylaxis to prevent DVT.  Patient benefited maximally from hospital stay and there were no complications.    Recent vital signs:  Patient Vitals for the past 24 hrs:  BP Temp Temp src Pulse Resp SpO2  07/12/17 0418 107/66 (!) 97.5 F (36.4 C) Oral 64 - 96 %  07/11/17 2212 126/85 98.7 F (37.1 C) Oral 76 18 97 %  07/11/17 1456 120/73 - - - - -  07/11/17 1446 (!) 96/55 - - (!) 59 17 -  07/11/17 1440 (!) 87/49 - - - - -  07/11/17 1400 133/85 98.2 F (36.8 C) Oral 73 18 95 %  07/11/17 1345 - (!) 97 F (36.1 C) - - - -  07/11/17 1315 - - - 73 20 94 %  07/11/17  1300 - - - 75 16 100 %  07/11/17 1252 123/79 - - 62 15 97 %  07/11/17 1245 - - - 61 13 97 %  07/11/17 1230 - - - 69 19 97 %  07/11/17 1222 118/81 - - 62 13 98 %  07/11/17 1215 - - - (!) 59 18 96 %  07/11/17 1200 - - - 62 13 97 %  07/11/17 1152 120/76 - - 65 (!) 22 96 %  07/11/17 1145 - - - 64 14 96 %  07/11/17 1137 123/80 - - 66 18 97 %  07/11/17 1130 - - - 65 14 97 %  07/11/17 1121 131/87 - - 65 17 99 %  07/11/17 1115 - - - 66 16 100 %  07/11/17 1107 130/76 - - 61 15 98 %  07/11/17 1100 - 97.7 F (36.5 C) - 64 14 96 %  07/11/17 1051 129/84 - - 61 15 97 %  07/11/17 1045 - - - 60 13 98 %  07/11/17 1037 128/90 - - 64 13 100 %  07/11/17 1030 - - - 63 13 100 %  07/11/17 1022 129/85 - - 65 19 99 %  07/11/17 1015 - - - 65 14 100 %  07/11/17 1007 124/83 - - 65 19 99 %  07/11/17 1000 - - -  66 14 98 %  07/11/17 0952 125/81 - - 68 19 98 %  07/11/17 0945 - - - 63 14 97 %  07/11/17 0936 135/84 - - 66 15 98 %  07/11/17 0930 - 97.8 F (36.6 C) - 72 17 99 %  07/11/17 0922 98/66 - - 66 15 96 %     Recent laboratory studies:   Recent Labs  07/12/17 0238  WBC 9.4  HGB 12.2*  HCT 38.4*  PLT 158  NA 142  K 4.4  CL 109  CO2 28  BUN 12  CREATININE 0.99  GLUCOSE 121*  CALCIUM 8.6*     Discharge Medications:   Allergies as of 07/12/2017   No Known Allergies     Medication List    STOP taking these medications   allopurinol 300 MG tablet Commonly known as:  ZYLOPRIM   diclofenac sodium 1 % Gel Commonly known as:  VOLTAREN   Fish Oil 1000 MG Caps   loratadine 10 MG tablet Commonly known as:  CLARITIN   PROBIOTIC ACIDOPHILUS PO     TAKE these medications   ALPRAZolam 0.25 MG tablet Commonly known as:  XANAX Take 0.25 mg by mouth daily.   aspirin EC 325 MG tablet Take 1 tablet (325 mg total) by mouth daily.   calcium carbonate 750 MG chewable tablet Commonly known as:  TUMS EX Chew 2 tablets by mouth as needed for heartburn.   diphenhydrAMINE 25 mg  capsule Commonly known as:  BENADRYL Take 25 mg by mouth daily.   ondansetron 4 MG tablet Commonly known as:  ZOFRAN Take 1 tablet (4 mg total) by mouth every 8 (eight) hours as needed for nausea or vomiting.   oxyCODONE-acetaminophen 5-325 MG tablet Commonly known as:  ROXICET Take 1-2 tablets by mouth every 4 (four) hours as needed.   vitamin C 1000 MG tablet Take 1,000 mg by mouth daily.            Discharge Care Instructions        Start     Ordered   07/11/17 0000  ondansetron (ZOFRAN) 4 MG tablet  Every 8 hours PRN     07/11/17 0933   07/11/17 0000  oxyCODONE-acetaminophen (ROXICET) 5-325 MG tablet  Every 4 hours PRN     07/11/17 0933   07/11/17 0000  aspirin EC 325 MG tablet  Daily    Comments:  1 tab a day for the next 30 days to prevent blood clots   07/11/17 0933      Diagnostic Studies: Dg Knee Right Port  Result Date: 07/11/2017 CLINICAL DATA:  Postop total knee replacement EXAM: PORTABLE RIGHT KNEE - 1-2 VIEW COMPARISON:  None. FINDINGS: Changes of right knee replacement. Soft tissue and joint space gas. No hardware bony complicating feature. IMPRESSION: Right knee replacement.  No complicating feature. Electronically Signed   By: Charlett NoseKevin  Dover M.D.   On: 07/11/2017 10:01    Disposition: Final discharge disposition not confirmed    Follow-up Information    Loreta AveMurphy, Daniel F, MD. Schedule an appointment as soon as possible for a visit in 2 weeks.   Specialty:  Orthopedic Surgery Contact information: [address] [place] [address] [phone]            Signed: Otilio SaberM Lindsey Demarie Uhlig 07/12/2017, 7:02 AM

## 2017-07-11 NOTE — Progress Notes (Signed)
Pt admitted to the unit from pacu; pt A&O x4; IV intact and transfusing; SCD's on; pt already voided per report; pt oriented to the unit and room; VSS; right knee incision has compression ace wrap dsg clean dry and intact with no stain or drainage noted. Pt report decrease sensation, numbness to BLE d/t spinal anesthesia received; pt MAE x4; CPM on; fall/safety precaution and prevention education completed with pt voices understanding. Call light within reach; and reported off to nurse Korie. Dionne Bucy RN

## 2017-07-11 NOTE — Progress Notes (Signed)
Orthopedic Tech Progress Note Patient Details:  Serafina RoyalsGreg R Mcgath 11/12/1956 [id number]  CPM Right Knee CPM Right Knee: On Right Knee Flexion (Degrees): 90 Right Knee Extension (Degrees): 0   [name] 07/11/2017, 10:25 AM ohf not applied because pt's weight exceeds durability of frame; RN notified

## 2017-07-11 NOTE — Evaluation (Signed)
Physical Therapy Evaluation Patient Details Name: Justin Charles MRN: [id number] DOB: [birth year] Today's Date: 07/11/2017   History of Present Illness  Pt is a 61 y/o male s/p elective R TKA. PMH includes anxiety and arthritis.   Clinical Impression  Pt s/p surgery above with deficits below. PTA, pt was independent with functional mobility. Upon eval, pt limited by post op pain and weakness, as well as, decreased balance. Required min guard assist for transfers this session. Limited to stand pivot transfer, as RN reporting syncopal episode when arrived to the floor. VSS throughout session and pt asymptomatic. Pt reports wife will be available to assist as needed and has all necessary DME at home. Reports he will be receiving HHPT upon d/c. Will continue to follow acutely to maximize functional mobility independence and safety.     Follow Up Recommendations DC plan and follow up therapy as arranged by surgeon;Supervision for mobility/OOB    Equipment Recommendations  None recommended by PT    Recommendations for Other Services       Precautions / Restrictions Precautions Precautions: Knee Precaution Booklet Issued: Yes (comment) Precaution Comments: Reviewed supine ther ex with pt.  Restrictions Weight Bearing Restrictions: Yes RLE Weight Bearing: Weight bearing as tolerated      Mobility  Bed Mobility Overal bed mobility: Needs Assistance Bed Mobility: Supine to Sit     Supine to sit: Supervision     General bed mobility comments: Supervision for safety.   Transfers Overall transfer level: Needs assistance Equipment used: Rolling walker (2 wheeled) Transfers: Sit to/from [employer] Sit to Stand: Min guard Stand pivot transfers: Min guard       General transfer comment: Min guard for safety. Verbal cues for safe hand placement. No symptoms reported upon standing and during transfer.   Ambulation/Gait             General Gait Details: Deferred  secondary to previous syncopal episode. See general comments.   Stairs            Wheelchair Mobility    Modified Rankin (Stroke Patients Only)       Balance Overall balance assessment: Needs assistance Sitting-balance support: No upper extremity supported;Feet supported Sitting balance-Leahy Scale: Good     Standing balance support: Bilateral upper extremity supported;During functional activity Standing balance-Leahy Scale: Poor Standing balance comment: Reliant on BUE support                              Pertinent Vitals/Pain Pain Assessment: 0-10 Pain Score: 5  Pain Location: R knee  Pain Descriptors / Indicators: Aching;Operative site guarding Pain Intervention(s): Limited activity within patient's tolerance;Monitored during session;Repositioned    Home Living Family/patient expects to be discharged to:: Private residence Living Arrangements: Spouse/significant other Available Help at Discharge: Family;Available 24 hours/day Type of Home: House Home Access: Stairs to enter Entrance Stairs-Rails: None Entrance Stairs-Number of Steps: 2 Home Layout: One level Home Equipment: Bedside commode;Walker - 2 wheels;Crutches      Prior Function Level of Independence: Independent               Hand Dominance   Dominant Hand: Right    Extremity/Trunk Assessment   Upper Extremity Assessment Upper Extremity Assessment: Defer to OT evaluation    Lower Extremity Assessment Lower Extremity Assessment: RLE deficits/detail RLE Deficits / Details: Sensory in tact. Deficits consistent with post op pain and weakness. Able to perform ther ex below.  Cervical / Trunk Assessment Cervical / Trunk Assessment: Normal  Communication   Communication: No difficulties  Cognition Arousal/Alertness: Awake/alert Behavior During Therapy: WFL for tasks assessed/performed Overall Cognitive Status: Within Functional Limits for tasks assessed                                         General Comments General comments (skin integrity, edema, etc.): Spoke with RN about pt before session and stated pt had syncopal episode, so wanted to limit mobility to stand pivot to chair and exercise. VSS throughout session.     Exercises Total Joint Exercises Ankle Circles/Pumps: AROM;Both;20 reps;Supine Quad Sets: AROM;Right;10 reps;Supine Towel Squeeze: AROM;Both;10 reps;Supine Short Arc Quad: AROM;Right;10 reps;Supine Heel Slides: AROM;Right;10 reps;Supine Hip ABduction/ADduction: AROM;Right;10 reps;Supine   Assessment/Plan    PT Assessment Patient needs continued PT services  PT Problem List Decreased strength;Decreased range of motion;Decreased activity tolerance;Decreased balance;Decreased mobility;Decreased knowledge of use of DME;Decreased knowledge of precautions;Pain       PT Treatment Interventions Gait training;DME instruction;Stair training;Functional mobility training;Therapeutic activities;Therapeutic exercise;Balance training;Neuromuscular re-education;Patient/family education    PT Goals (Current goals can be found in the Care Plan section)  Acute Rehab PT Goals Patient Stated Goal: to go home  PT Goal Formulation: With patient Time For Goal Achievement: 07/18/17 Potential to Achieve Goals: Good    Frequency 7X/week   Barriers to discharge        Co-evaluation               AM-PAC PT "6 Clicks" Daily Activity  Outcome Measure Difficulty turning over in bed (including adjusting bedclothes, sheets and blankets)?: None Difficulty moving from lying on back to sitting on the side of the bed? : None Difficulty sitting down on and standing up from a chair with arms (e.g., wheelchair, bedside commode, etc,.)?: Unable Help needed moving to and from a bed to chair (including a wheelchair)?: A Little Help needed walking in hospital room?: A Little Help needed climbing 3-5 steps with a railing? : A Lot 6 Click Score:  17    End of Session Equipment Utilized During Treatment: Gait belt Activity Tolerance: Patient tolerated treatment well Patient left: in chair;with call bell/phone within reach;with family/visitor present Nurse Communication: Mobility status PT Visit Diagnosis: Other abnormalities of gait and mobility (R26.89);Pain Pain - Right/Left: Right Pain - part of body: Knee    Time: [id number] PT Time Calculation (min) (ACUTE ONLY): 27 min   Charges:   PT Evaluation $PT Eval Low Complexity: 1 Low PT Treatments $Therapeutic Exercise: 8-22 mins   PT G Codes:        Gladys DammeBrittany Theodore Rahrig, PT, DPT  Acute Rehabilitation Services  Pager: [phone]   Lehman PromBrittany S Makenzye Troutman 07/11/2017, 5:43 PM

## 2017-07-11 NOTE — Transfer of Care (Signed)
Immediate Anesthesia Transfer of Care Note  Patient: Justin RoyalsGreg R Cara  Procedure(s) Performed: Procedure(s): TOTAL KNEE ARTHROPLASTY (Right)  Patient Location: PACU  Anesthesia Type:Spinal  Level of Consciousness: drowsy  Airway & Oxygen Therapy: Patient Spontanous Breathing and Patient connected to face mask oxygen  Post-op Assessment: Report given to RN and Post -op Vital signs reviewed and stable  Post vital signs: Reviewed and stable  Last Vitals:  Vitals:   07/11/17 0618 07/11/17 0621  BP: (!) 162/100 134/81  Pulse: 75   Resp: 20   Temp: 36.8 C   SpO2: 99%     Last Pain:  Vitals:   07/11/17 0618  TempSrc: Oral         Complications: No apparent anesthesia complications

## 2017-07-11 NOTE — Anesthesia Preprocedure Evaluation (Addendum)
Anesthesia Evaluation  Patient identified by MRN, date of birth, ID band Patient awake    Reviewed: Allergy & Precautions, NPO status , Patient's Chart, lab work & pertinent test results  Airway Mallampati: I  TM Distance: >3 FB Neck ROM: Full    Dental no notable dental hx.    Pulmonary neg pulmonary ROS,    breath sounds clear to auscultation       Cardiovascular negative cardio ROS   Rhythm:Regular Rate:Normal     Neuro/Psych negative neurological ROS  negative psych ROS   GI/Hepatic negative GI ROS, Neg liver ROS,   Endo/Other  negative endocrine ROS  Renal/GU negative Renal ROS  negative genitourinary   Musculoskeletal  (+) Arthritis ,   Abdominal   Peds negative pediatric ROS (+)  Hematology negative hematology ROS (+)   Anesthesia Other Findings   Reproductive/Obstetrics negative OB ROS                            Anesthesia Physical Anesthesia Plan  ASA: II  Anesthesia Plan: Spinal   Post-op Pain Management:    Induction: Intravenous  PONV Risk Score and Plan: 2 and Ondansetron and Dexamethasone  Airway Management Planned: Natural Airway and Simple Face Mask  Additional Equipment:   Intra-op Plan:   Post-operative Plan:   Informed Consent: I have reviewed the patients History and Physical, chart, labs and discussed the procedure including the risks, benefits and alternatives for the proposed anesthesia with the patient or authorized representative who has indicated his/her understanding and acceptance.     Plan Discussed with:   Anesthesia Plan Comments:         Anesthesia Quick Evaluation

## 2017-07-11 NOTE — Interval H&P Note (Signed)
History and Physical Interval Note:  07/11/2017 7:27 AM  Justin Charles  has presented today for surgery, with the diagnosis of djd right knee  The various methods of treatment have been discussed with the patient and family. After consideration of risks, benefits and other options for treatment, the patient has consented to  Procedure(s): TOTAL KNEE ARTHROPLASTY (Right) as a surgical intervention .  The patient's history has been reviewed, patient examined, no change in status, stable for surgery.  I have reviewed the patient's chart and labs.  Questions were answered to the patient's satisfaction.     Loreta Aveaniel F Lamoyne Palencia

## 2017-07-12 ENCOUNTER — Encounter (HOSPITAL_COMMUNITY): Payer: Self-pay | Admitting: Orthopedic Surgery

## 2017-07-12 LAB — BASIC METABOLIC PANEL
ANION GAP: 5 (ref 5–15)
BUN: 12 mg/dL (ref 6–20)
CHLORIDE: 109 mmol/L (ref 101–111)
CO2: 28 mmol/L (ref 22–32)
CREATININE: 0.99 mg/dL (ref 0.61–1.24)
Calcium: 8.6 mg/dL — ABNORMAL LOW (ref 8.9–10.3)
GFR calc non Af Amer: >60 mL/min (ref >60)
Glucose, Bld: 121 mg/dL — ABNORMAL HIGH (ref 65–99)
POTASSIUM: 4.4 mmol/L (ref 3.5–5.1)
SODIUM: 142 mmol/L (ref 135–145)

## 2017-07-12 LAB — CBC
HEMATOCRIT: 38.4 % — AB (ref 39.0–52.0)
HEMOGLOBIN: 12.2 g/dL — AB (ref 13.0–17.0)
MCH: 31.8 pg (ref 26.0–34.0)
MCHC: 31.8 g/dL (ref 30.0–36.0)
MCV: 100 fL (ref 78.0–100.0)
Platelets: 158 10*3/uL (ref 150–400)
RBC: 3.84 MIL/uL — AB (ref 4.22–5.81)
RDW: 13.4 % (ref 11.5–15.5)
WBC: 9.4 10*3/uL (ref 4.0–10.5)

## 2017-07-12 NOTE — Progress Notes (Signed)
Physical Therapy Treatment Patient Details Name: Justin Charles MRN: [id number] DOB: 01/29/1956 Today's Date: 07/12/2017    History of Present Illness Pt is a 61 y/o male s/p elective R TKA. PMH includes anxiety and arthritis.     PT Comments    Pt reviewed HEP and stair training in prep for d/c home.  Pt ready to d/c home after session and RN informed.     Follow Up Recommendations  DC plan and follow up therapy as arranged by surgeon;Supervision for mobility/OOB     Equipment Recommendations  None recommended by PT    Recommendations for Other Services       Precautions / Restrictions Precautions Precautions: Knee Precaution Booklet Issued: Yes (comment) Precaution Comments: verbally reviewed knee precautions  Restrictions Weight Bearing Restrictions: Yes RLE Weight Bearing: Weight bearing as tolerated    Mobility  Bed Mobility Overal bed mobility: Needs Assistance Bed Mobility: Supine to Sit     Supine to sit: Supervision;HOB elevated     General bed mobility comments: Pt seated in recliner on arrival.    Transfers Overall transfer level: Needs assistance Equipment used: Rolling walker (2 wheeled) Transfers: Sit to/from Stand Sit to Stand: Modified independent (Device/Increase time)         General transfer comment: Good technique no assistance needed.    Ambulation/Gait Ambulation/Gait assistance: Supervision Ambulation Distance (Feet): 200 Feet Assistive device: Rolling walker (2 wheeled) Gait Pattern/deviations: Step-through pattern;Decreased stance time - right;Decreased stride length;Trunk flexed;Antalgic   Gait velocity interpretation: Below normal speed for age/gender General Gait Details: Cues for upper trunk control, cues for gait symmetry and RW safety.     Stairs Stairs: Yes   Stair Management: No rails;Backwards;Step to pattern Number of Stairs: 4 General stair comments: Cues for sequencing and RW placement.  Pt's wife present and  performed x 2 stairs for carryover at home.  Pt required min assist to keep RW steady.    Wheelchair Mobility    Modified Rankin (Stroke Patients Only)       Balance Overall balance assessment: Needs assistance Sitting-balance support: No upper extremity supported;Feet supported Sitting balance-Leahy Scale: Normal Sitting balance - Comments: static sitting EOB    Standing balance support: Bilateral upper extremity supported Standing balance-Leahy Scale: Good Standing balance comment: Reliant on BUE support during mobility                             Cognition Arousal/Alertness: Awake/alert Behavior During Therapy: WFL for tasks assessed/performed Overall Cognitive Status: Within Functional Limits for tasks assessed                                        Exercises Total Joint Exercises Ankle Circles/Pumps: AROM;Both;20 reps;Supine Quad Sets: AROM;Right;10 reps;Supine Towel Squeeze: AROM;Both;10 reps;Supine Short Arc Quad: AROM;Right;10 reps;Supine Heel Slides: AROM;Right;10 reps;Supine Hip ABduction/ADduction: AROM;Right;10 reps;Supine Straight Leg Raises: AROM;Right;10 reps;Supine Goniometric ROM: 3-83 degrees flexion in R knee.    General Comments General comments (skin integrity, edema, etc.): Pt's spouse present during session       Pertinent Vitals/Pain Pain Assessment: 0-10 Pain Score: 7  Faces Pain Scale: Hurts little more Pain Location: R thigh  Pain Descriptors / Indicators: Aching;Sore Pain Intervention(s): Monitored during session;Repositioned    Home Living Family/patient expects to be discharged to:: Private residence Living Arrangements: Spouse/significant other Available Help at Discharge: Family;Available 24 hours/day  Type of Home: House Home Access: Stairs to enter Entrance Stairs-Rails: None Home Layout: One level Home Equipment: Bedside commode;Walker - 2 wheels;Crutches      Prior Function Level of  Independence: Independent          PT Goals (current goals can now be found in the care plan section) Acute Rehab PT Goals Patient Stated Goal: to go home  Potential to Achieve Goals: Good Progress towards PT goals: Progressing toward goals    Frequency    7X/week      PT Plan Current plan remains appropriate    Co-evaluation              AM-PAC PT "6 Clicks" Daily Activity  Outcome Measure  Difficulty turning over in bed (including adjusting bedclothes, sheets and blankets)?: None Difficulty moving from lying on back to sitting on the side of the bed? : None Difficulty sitting down on and standing up from a chair with arms (e.g., wheelchair, bedside commode, etc,.)?: None Help needed moving to and from a bed to chair (including a wheelchair)?: None Help needed walking in hospital room?: A Little Help needed climbing 3-5 steps with a railing? : A Little 6 Click Score: 22    End of Session Equipment Utilized During Treatment: Gait belt Activity Tolerance: Patient tolerated treatment well Patient left: in chair;with call bell/phone within reach;with family/visitor present Nurse Communication: Mobility status (Pt ready for d/c home.  ) PT Visit Diagnosis: Other abnormalities of gait and mobility (R26.89);Pain Pain - Right/Left: Right Pain - part of body: Knee     Time: 4098-1191 PT Time Calculation (min) (ACUTE ONLY): 24 min  Charges:  $Gait Training: 8-22 mins $Therapeutic Exercise: 8-22 mins                  G Codes:       Joycelyn Rua, PTA pager [phone]    Florestine Avers 07/12/2017, 1:25 PM

## 2017-07-12 NOTE — Evaluation (Signed)
Occupational Therapy Evaluation Patient Details Name: Justin Charles R Charles MRN: [id number] DOB: 06/28/1956 Today's Date: 07/12/2017    History of Present Illness Pt is a 61 y/o male s/p elective R TKA. PMH includes anxiety and arthritis.    Clinical Impression   This 61 y/o M presents with the above. At baseline Pt is independent with ADLs and functional mobility. Pt currently requires MinGuard assist for functional mobility at RW level, ModA for LB ADLs. Pt will return home with spouse who is able to assist with ADLs PRN. Education provided and questions answered throughout. Pt reports feeling comfortable completing ADLs after return home and with spouse assist PRN. No further acute OT needs identified at this time. Will sign off.     Follow Up Recommendations  DC plan and follow up therapy as arranged by surgeon;Supervision/Assistance - 24 hour (initially )    Equipment Recommendations  None recommended by OT           Precautions / Restrictions Precautions Precautions: Knee Precaution Comments: verbally reviewed knee precautions  Restrictions Weight Bearing Restrictions: Yes RLE Weight Bearing: Weight bearing as tolerated      Mobility Bed Mobility Overal bed mobility: Needs Assistance Bed Mobility: Supine to Sit     Supine to sit: Supervision;HOB elevated     General bed mobility comments: Supervision for safety.   Transfers Overall transfer level: Needs assistance Equipment used: Rolling walker (2 wheeled) Transfers: Sit to/from Stand Sit to Stand: Min guard         General transfer comment: Min guard for safety. Verbal cues for safe hand placement. No symptoms reported upon standing and during transfer.     Balance Overall balance assessment: Needs assistance Sitting-balance support: No upper extremity supported;Feet supported Sitting balance-Leahy Scale: Good Sitting balance - Comments: static sitting EOB    Standing balance support: Bilateral upper extremity  supported Standing balance-Leahy Scale: Poor Standing balance comment: Reliant on BUE support during mobility                            ADL either performed or assessed with clinical judgement   ADL Overall ADL's : Needs assistance/impaired Eating/Feeding: Set up;Sitting   Grooming: Min guard;Standing;Wash/dry hands   Upper Body Bathing: Min guard;Sitting   Lower Body Bathing: Minimal assistance;Sit to/from stand   Upper Body Dressing : Min guard;Sitting   Lower Body Dressing: Moderate assistance;Sit to/from stand   Toilet Transfer: Min guard;Ambulation;BSC;RW Toilet Transfer Details (indicate cue type and reason): BSC over toilet  Toileting- Clothing Manipulation and Hygiene: Min guard;Sit to/from stand   Tub/ Shower Transfer: Walk-in shower;Min guard;Ambulation;3 in 1;Rolling walker Tub/Shower Transfer Details (indicate cue type and reason): simulated with Pt return demonstrating technique/sequence using RW; educated on use of 3:1 as shower chair  Functional mobility during ADLs: Min guard;Rolling walker General ADL Comments: educated on compensatory techniques for completing ADLs                          Pertinent Vitals/Pain Pain Assessment: Faces Faces Pain Scale: Hurts little more Pain Location: R thigh  Pain Descriptors / Indicators: Aching;Sore Pain Intervention(s): Monitored during session;Repositioned;Ice applied;Limited activity within patient's tolerance     Hand Dominance Right   Extremity/Trunk Assessment Upper Extremity Assessment Upper Extremity Assessment: Overall WFL for tasks assessed   Lower Extremity Assessment Lower Extremity Assessment: Defer to PT evaluation       Communication Communication Communication: No difficulties  Cognition Arousal/Alertness: Awake/alert Behavior During Therapy: WFL for tasks assessed/performed Overall Cognitive Status: Within Functional Limits for tasks assessed                                      General Comments  Pt's spouse present during session                Home Living Family/patient expects to be discharged to:: Private residence Living Arrangements: Spouse/significant other Available Help at Discharge: Family;Available 24 hours/day Type of Home: House Home Access: Stairs to enter [employer] of Steps: 2 Entrance Stairs-Rails: None Home Layout: One level     Bathroom Shower/Tub: Tub/shower unit;Walk-in Human resources officer: Standard     Home Equipment: Bedside commode;Walker - 2 wheels;Crutches          Prior Functioning/Environment Level of Independence: Independent                 OT Problem List: Decreased strength;Decreased activity tolerance;Decreased knowledge of use of DME or AE;Decreased knowledge of precautions            OT Goals(Current goals can be found in the care plan section) Acute Rehab OT Goals Patient Stated Goal: to go home  OT Goal Formulation: With patient                                 AM-PAC PT "6 Clicks" Daily Activity     Outcome Measure Help from another person eating meals?: None Help from another person taking care of personal grooming?: A Little Help from another person toileting, which includes using toliet, bedpan, or urinal?: A Little Help from another person bathing (including washing, rinsing, drying)?: A Little Help from another person to put on and taking off regular upper body clothing?: None Help from another person to put on and taking off regular lower body clothing?: A Lot 6 Click Score: 19   End of Session Equipment Utilized During Treatment: Gait belt;Rolling walker Nurse Communication: Mobility status  Activity Tolerance: Patient tolerated treatment well Patient left: in chair;with call bell/phone within reach;with family/visitor present  OT Visit Diagnosis: Unsteadiness on feet (R26.81)                Time: 1610-9604 OT Time Calculation  (min): 30 min Charges:  OT General Charges $OT Visit: 1 Visit OT Evaluation $OT Eval Low Complexity: 1 Low OT Treatments $Self Care/Home Management : 8-22 mins G-Codes:     Marcy Siren, OT Pager [phone] 07/12/2017  [name] 07/12/2017, 10:15 AM

## 2017-07-12 NOTE — Care Management Note (Signed)
Case Management Note  Patient Details  Name: Justin Charles MRN: [id number] Date of Birth: 12/05/1955  Subjective/Objective: 61 yr old gentleman s/p right total knee arthroplasty.                   Action/Plan: Case manager spoke with patient and his wife concerning discharge plan and DME needs. Patient was preoperatively setup with [facility], no changes. DME has been delivered to his home. , Patient will have family support at discharge.   Expected Discharge Date:  07/12/17               Expected Discharge Plan:  Home w Home Health Services  In-House Referral:  NA  Discharge planning Services  CM Consult  Post Acute Care Choice:  Durable Medical Equipment, Home Health Choice offered to:  Patient  DME Arranged:  3-N-1, Walker rolling, CPM DME Agency:  [employer]  HH Arranged:  PT HH Agency:  [facility]  Status of Service:  Completed, signed off  If discussed at [employer] Meetings, dates discussed:    Additional Comments:  Durenda GuthrieBrady, Lesslie Mckeehan Naomi, RN 07/12/2017, 9:49 AM

## 2017-07-12 NOTE — Progress Notes (Signed)
Subjective: 1 Day Post-Op Procedure(s) (LRB): TOTAL KNEE ARTHROPLASTY (Right) Patient reports pain as mild.  Near syncopal episode from hypotension yesterday.  Doing much better today without recurrent episode.  Objective: Vital signs in last 24 hours: Temp:  [97 F (36.1 C)-98.7 F (37.1 C)] 97.5 F (36.4 C) (08/30 0418) Pulse Rate:  [59-76] 64 (08/30 0418) Resp:  [13-22] 18 (08/29 2212) BP: (87-135)/(49-90) 107/66 (08/30 0418) SpO2:  [94 %-100 %] 96 % (08/30 0418)  Intake/Output from previous day: 08/29 0701 - 08/30 0700 In: 3253.3 [P.O.:480; I.V.:1673.3; IV Piggyback:1100] Out: 1805 [Urine:1800; Blood:5] Intake/Output this shift: No intake/output data recorded.   Recent Labs  07/12/17 0238  HGB 12.2*    Recent Labs  07/12/17 0238  WBC 9.4  RBC 3.84*  HCT 38.4*  PLT 158    Recent Labs  07/12/17 0238  NA 142  K 4.4  CL 109  CO2 28  BUN 12  CREATININE 0.99  GLUCOSE 121*  CALCIUM 8.6*   No results for input(s): LABPT, INR in the last 72 hours.  Neurologically intact Neurovascular intact Sensation intact distally Intact pulses distally Dorsiflexion/Plantar flexion intact Incision: dressing C/D/I No cellulitis present Compartment soft  Assessment/Plan: 1 Day Post-Op Procedure(s) (LRB): TOTAL KNEE ARTHROPLASTY (Right) Advance diet Up with therapy Discharge home with home health likely after second session of PT as long as he mobilizes well.  Otherwise, d/c tomorrow. WBAT RLE PLEASE REMOVE ACE BANDAGE AND APPLY TED HOSE TO OPERATIVE EXTREMITY  Otilio SaberM Lindsey Stanbery 07/12/2017, 7:24 AM

## 2017-07-12 NOTE — Progress Notes (Signed)
Physical Therapy Treatment Patient Details Name: Justin RoyalsGreg R Charles MRN: [id number] DOB: 10/17/1956 Today's Date: 07/12/2017    History of Present Illness Pt is a 61 y/o male s/p elective R TKA. PMH includes anxiety and arthritis.     PT Comments    Pt performed gait and review of HEP during session this am.  Will f/u im pm for stair training.     Follow Up Recommendations  DC plan and follow up therapy as arranged by surgeon;Supervision for mobility/OOB     Equipment Recommendations  None recommended by PT    Recommendations for Other Services       Precautions / Restrictions Precautions Precautions: Knee Precaution Booklet Issued: Yes (comment) Precaution Comments: verbally reviewed knee precautions  Restrictions Weight Bearing Restrictions: Yes RLE Weight Bearing: Weight bearing as tolerated    Mobility  Bed Mobility Overal bed mobility: Needs Assistance Bed Mobility: Supine to Sit     Supine to sit: Supervision;HOB elevated     General bed mobility comments: Pt seated in recliner on arrival.    Transfers Overall transfer level: Needs assistance Equipment used: Rolling walker (2 wheeled) Transfers: Sit to/from Stand Sit to Stand: Supervision         General transfer comment: Good technique, supervision for safety.    Ambulation/Gait Ambulation/Gait assistance: Min guard Ambulation Distance (Feet): 200 Feet Assistive device: Rolling walker (2 wheeled) Gait Pattern/deviations: Step-through pattern;Decreased stance time - right;Decreased stride length;Trunk flexed;Antalgic     General Gait Details: Cues for upper trunk control, cues for gait symmetry and RW safety.     Stairs            Wheelchair Mobility    Modified Rankin (Stroke Patients Only)       Balance Overall balance assessment: Needs assistance Sitting-balance support: No upper extremity supported;Feet supported Sitting balance-Leahy Scale: Good Sitting balance - Comments: static  sitting EOB    Standing balance support: Bilateral upper extremity supported Standing balance-Leahy Scale: Fair Standing balance comment: Reliant on BUE support during mobility                             Cognition Arousal/Alertness: Awake/alert Behavior During Therapy: WFL for tasks assessed/performed Overall Cognitive Status: Within Functional Limits for tasks assessed                                        Exercises Total Joint Exercises Ankle Circles/Pumps: AROM;Both;20 reps;Supine Quad Sets: AROM;Right;10 reps;Supine Towel Squeeze: AROM;Both;10 reps;Supine Short Arc Quad: AROM;Right;10 reps;Supine Heel Slides: AROM;Right;10 reps;Supine Hip ABduction/ADduction: AROM;Right;10 reps;Supine Straight Leg Raises: AROM;Right;10 reps;Supine Goniometric ROM: 3-83 degrees flexion in R knee.    General Comments General comments (skin integrity, edema, etc.): Pt's spouse present during session       Pertinent Vitals/Pain Pain Assessment: 0-10 Pain Score: 7  Faces Pain Scale: Hurts little more Pain Location: R thigh  Pain Descriptors / Indicators: Aching;Sore Pain Intervention(s): Monitored during session;Repositioned;Ice applied    Home Living Family/patient expects to be discharged to:: Private residence Living Arrangements: Spouse/significant other Available Help at Discharge: Family;Available 24 hours/day Type of Home: House Home Access: Stairs to enter Entrance Stairs-Rails: None Home Layout: One level Home Equipment: Bedside commode;Walker - 2 wheels;Crutches      Prior Function Level of Independence: Independent          PT Goals (current goals  can now be found in the care plan section) Acute Rehab PT Goals Patient Stated Goal: to go home  Potential to Achieve Goals: Good Progress towards PT goals: Progressing toward goals    Frequency    7X/week      PT Plan Current plan remains appropriate    Co-evaluation               AM-PAC PT "6 Clicks" Daily Activity  Outcome Measure  Difficulty turning over in bed (including adjusting bedclothes, sheets and blankets)?: None Difficulty moving from lying on back to sitting on the side of the bed? : None Difficulty sitting down on and standing up from a chair with arms (e.g., wheelchair, bedside commode, etc,.)?: None Help needed moving to and from a bed to chair (including a wheelchair)?: A Little Help needed walking in hospital room?: A Little Help needed climbing 3-5 steps with a railing? : A Little 6 Click Score: 21    End of Session Equipment Utilized During Treatment: Gait belt Activity Tolerance: Patient tolerated treatment well Patient left: in chair;with call bell/phone within reach;with family/visitor present Nurse Communication: Mobility status PT Visit Diagnosis: Other abnormalities of gait and mobility (R26.89);Pain Pain - Right/Left: Right Pain - part of body: Knee     Time: 1610-9604 PT Time Calculation (min) (ACUTE ONLY): 18 min  Charges:  $Therapeutic Activity: 8-22 mins                    G Codes:       Joycelyn Rua, PTA pager [phone]    Florestine Avers 07/12/2017, 12:59 PM

## 2018-08-02 ENCOUNTER — Other Ambulatory Visit: Payer: Self-pay

## 2018-08-02 ENCOUNTER — Emergency Department (HOSPITAL_COMMUNITY): Payer: Managed Care, Other (non HMO)

## 2018-08-02 ENCOUNTER — Emergency Department (HOSPITAL_COMMUNITY)
Admission: EM | Admit: 2018-08-02 | Discharge: 2018-08-02 | Disposition: A | Discharge status: Home / Self Care | Payer: Managed Care, Other (non HMO) | Attending: Emergency Medicine | Admitting: Emergency Medicine

## 2018-08-02 ENCOUNTER — Encounter (HOSPITAL_COMMUNITY): Payer: Self-pay

## 2018-08-02 DIAGNOSIS — Z79899 Other long term (current) drug therapy: Secondary | ICD-10-CM | POA: Diagnosis not present

## 2018-08-02 DIAGNOSIS — R0789 Other chest pain: Secondary | ICD-10-CM | POA: Diagnosis not present

## 2018-08-02 DIAGNOSIS — Z96651 Presence of right artificial knee joint: Secondary | ICD-10-CM | POA: Diagnosis not present

## 2018-08-02 DIAGNOSIS — R030 Elevated blood-pressure reading, without diagnosis of hypertension: Secondary | ICD-10-CM | POA: Diagnosis not present

## 2018-08-02 DIAGNOSIS — R519 Headache, unspecified: Secondary | ICD-10-CM

## 2018-08-02 DIAGNOSIS — R51 Headache: Secondary | ICD-10-CM | POA: Insufficient documentation

## 2018-08-02 DIAGNOSIS — M7918 Myalgia, other site: Secondary | ICD-10-CM

## 2018-08-02 LAB — CBC
HCT: 46.3 % (ref 39.0–52.0)
HEMOGLOBIN: 15 g/dL (ref 13.0–17.0)
MCH: 32.8 pg (ref 26.0–34.0)
MCHC: 32.4 g/dL (ref 30.0–36.0)
MCV: 101.3 fL — ABNORMAL HIGH (ref 78.0–100.0)
Platelets: 153 10*3/uL (ref 150–400)
RBC: 4.57 MIL/uL (ref 4.22–5.81)
RDW: 12.7 % (ref 11.5–15.5)
WBC: 5.4 10*3/uL (ref 4.0–10.5)

## 2018-08-02 LAB — BASIC METABOLIC PANEL
ANION GAP: 10 (ref 5–15)
BUN: 12 mg/dL (ref 8–23)
CALCIUM: 9.6 mg/dL (ref 8.9–10.3)
CO2: 28 mmol/L (ref 22–32)
Chloride: 104 mmol/L (ref 98–111)
Creatinine, Ser: 1.05 mg/dL (ref 0.61–1.24)
GFR calc Af Amer: >60 mL/min (ref >60)
Glucose, Bld: 90 mg/dL (ref 70–99)
Potassium: 4.2 mmol/L (ref 3.5–5.1)
SODIUM: 142 mmol/L (ref 135–145)

## 2018-08-02 LAB — I-STAT TROPONIN, ED: TROPONIN I, POC: 0 ng/mL (ref 0.00–0.08)

## 2018-08-02 MED ORDER — ACETAMINOPHEN 500 MG PO TABS
500.0000 mg | ORAL_TABLET | Freq: Once | ORAL | Status: AC
  Administered 2018-08-02: 500 mg via ORAL
  Filled 2018-08-02: qty 1

## 2018-08-02 NOTE — ED Notes (Signed)
ED Provider at bedside. 
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
